# Patient Record
Sex: Male | Born: 1980 | Race: White | Hispanic: No | Marital: Married | Smoking: Former smoker
Health system: Southern US, Community
[De-identification: ages and names within clinical notes are randomized; demographics above are authoritative.]

## PROBLEM LIST (undated history)

## (undated) DIAGNOSIS — F419 Anxiety disorder, unspecified: Secondary | ICD-10-CM

## (undated) DIAGNOSIS — D649 Anemia, unspecified: Secondary | ICD-10-CM

## (undated) DIAGNOSIS — G4730 Sleep apnea, unspecified: Secondary | ICD-10-CM

## (undated) DIAGNOSIS — R7303 Prediabetes: Secondary | ICD-10-CM

## (undated) DIAGNOSIS — I10 Essential (primary) hypertension: Secondary | ICD-10-CM

## (undated) DIAGNOSIS — R519 Headache, unspecified: Secondary | ICD-10-CM

## (undated) HISTORY — PX: WISDOM TOOTH EXTRACTION: SHX21

## (undated) HISTORY — DX: Prediabetes: R73.03

## (undated) HISTORY — PX: SEPTOPLASTY: SUR1290

---

## 2011-03-09 ENCOUNTER — Emergency Department: Payer: Self-pay | Admitting: Internal Medicine

## 2013-08-01 ENCOUNTER — Ambulatory Visit: Payer: Self-pay | Admitting: Emergency Medicine

## 2013-08-18 ENCOUNTER — Ambulatory Visit: Payer: Self-pay | Admitting: Family Medicine

## 2013-09-06 ENCOUNTER — Ambulatory Visit: Payer: Self-pay | Admitting: Physician Assistant

## 2013-11-07 ENCOUNTER — Emergency Department: Payer: Self-pay | Admitting: Emergency Medicine

## 2014-06-20 DIAGNOSIS — J342 Deviated nasal septum: Secondary | ICD-10-CM | POA: Insufficient documentation

## 2014-06-20 DIAGNOSIS — R0982 Postnasal drip: Secondary | ICD-10-CM | POA: Insufficient documentation

## 2014-10-01 ENCOUNTER — Emergency Department: Payer: Self-pay | Admitting: Emergency Medicine

## 2014-12-10 DIAGNOSIS — E781 Pure hyperglyceridemia: Secondary | ICD-10-CM | POA: Insufficient documentation

## 2014-12-10 DIAGNOSIS — D649 Anemia, unspecified: Secondary | ICD-10-CM | POA: Insufficient documentation

## 2014-12-19 DIAGNOSIS — J3489 Other specified disorders of nose and nasal sinuses: Secondary | ICD-10-CM | POA: Insufficient documentation

## 2015-05-03 ENCOUNTER — Ambulatory Visit
Admission: RE | Admit: 2015-05-03 | Discharge: 2015-05-03 | Disposition: A | Discharge status: Home / Self Care | Source: Ambulatory Visit | Attending: Internal Medicine | Admitting: Internal Medicine

## 2015-05-03 ENCOUNTER — Other Ambulatory Visit: Payer: Self-pay | Admitting: Internal Medicine

## 2015-05-03 DIAGNOSIS — R1011 Right upper quadrant pain: Secondary | ICD-10-CM | POA: Diagnosis not present

## 2015-05-03 DIAGNOSIS — R1010 Upper abdominal pain, unspecified: Secondary | ICD-10-CM | POA: Diagnosis present

## 2015-05-03 DIAGNOSIS — R102 Pelvic and perineal pain: Secondary | ICD-10-CM

## 2015-05-03 MED ORDER — IOHEXOL 350 MG/ML SOLN
115.0000 mL | Freq: Once | INTRAVENOUS | Status: AC | PRN
  Administered 2015-05-03: 115 mL via INTRAVENOUS

## 2016-06-25 IMAGING — CT CT ABD-PELV W/ CM
1 of 2 series · 15 of 32 positions shown, 19 images · IV contrast (omnipaque)
Comparison: None.

CLINICAL DATA: Right upper quadrant abdominal pain. Right pelvic
pain in male. Nausea.

EXAM:
CT ABDOMEN AND PELVIS WITH CONTRAST
TECHNIQUE: Multidetector CT imaging of the abdomen and pelvis was performed
using the standard protocol following bolus administration of
intravenous contrast.
CONTRAST:  115mL OMNIPAQUE IOHEXOL 350 MG/ML SOLN

[Series 2: routine abd pel with · axial · 0.79mm/px · z∈[+198,+708]mm · 15 of 112 slices shown, 19 images]
[im 5/112  soft-tissue]
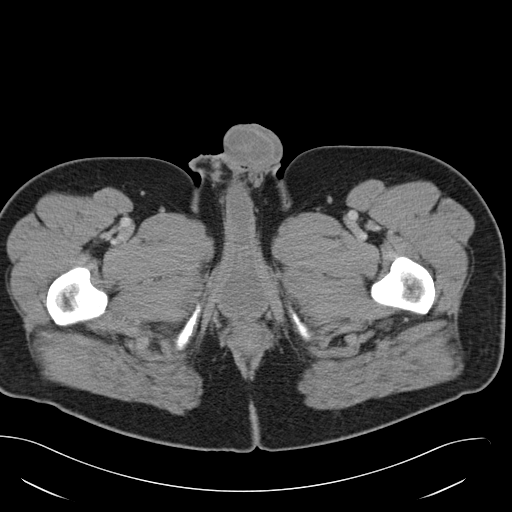
[im 5/112  bone]
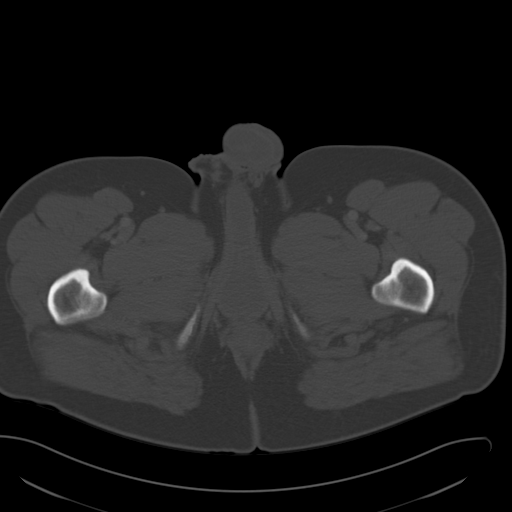
[im 14/112  soft-tissue]
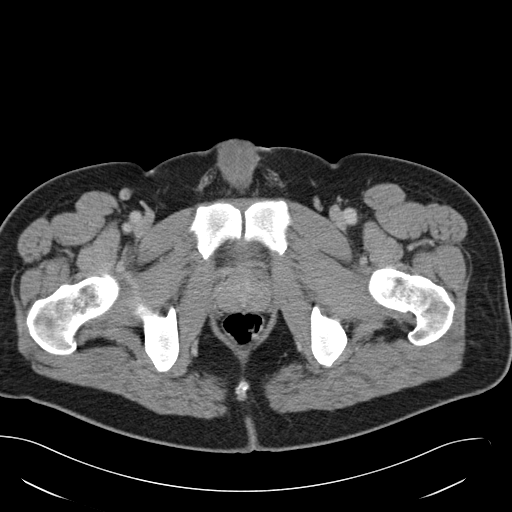
[im 23/112  soft-tissue]
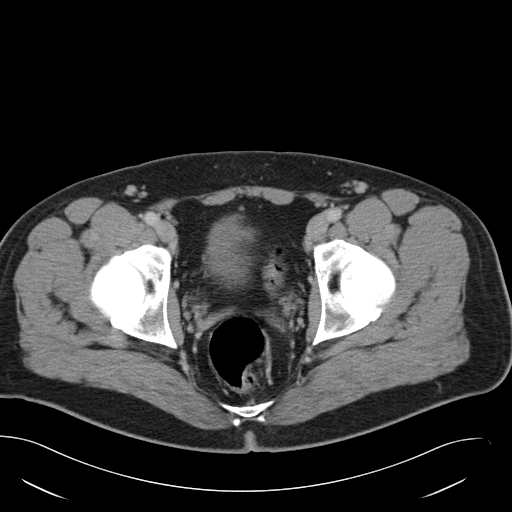
[im 32/112  soft-tissue]
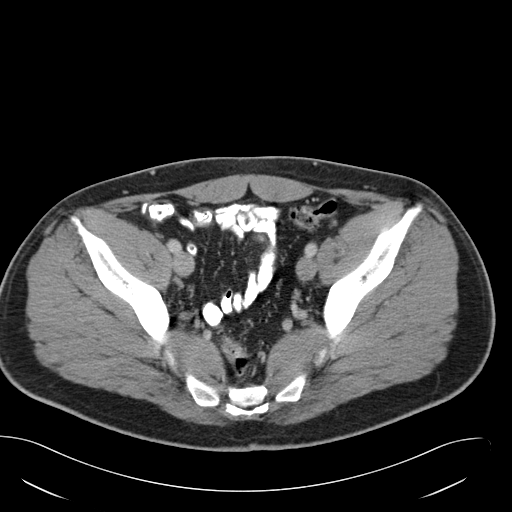
[im 40/112  soft-tissue]
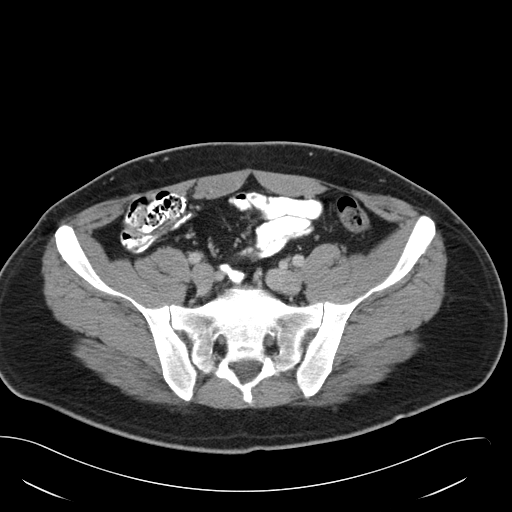
[im 49/112  soft-tissue]
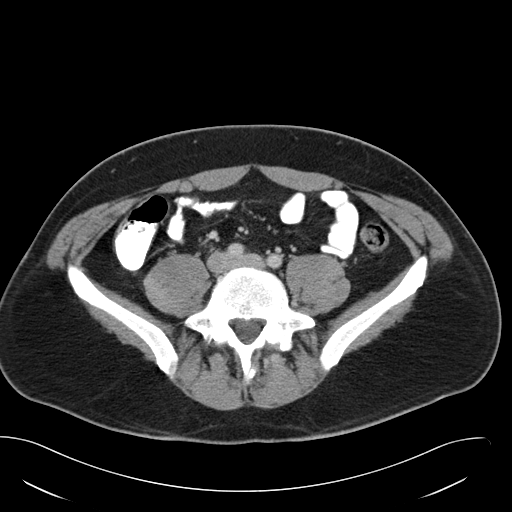
[im 58/112  soft-tissue]
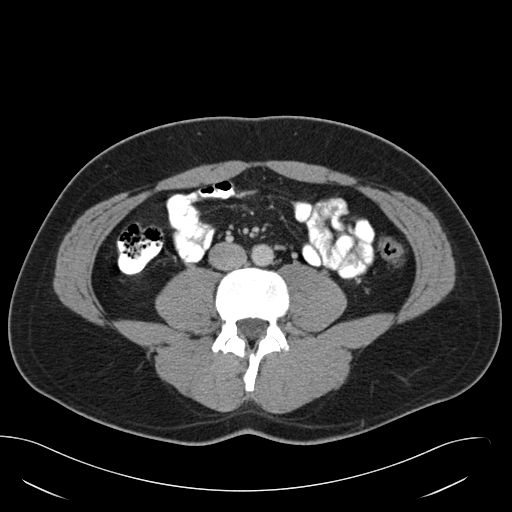
[im 63/112  soft-tissue]
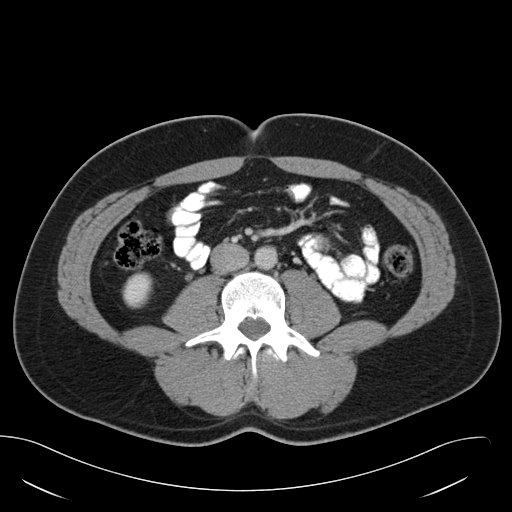
[im 72/112  soft-tissue]
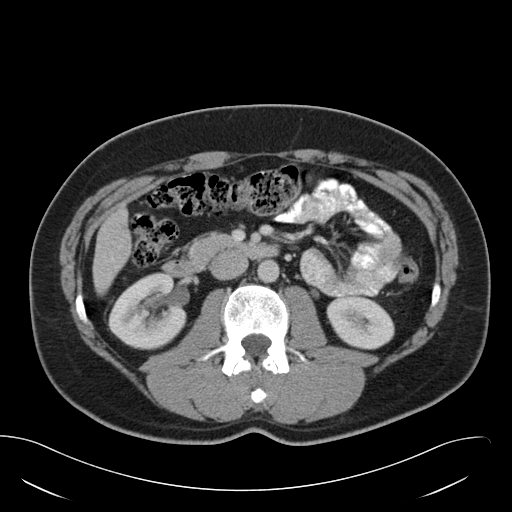
[im 72/112  bone]
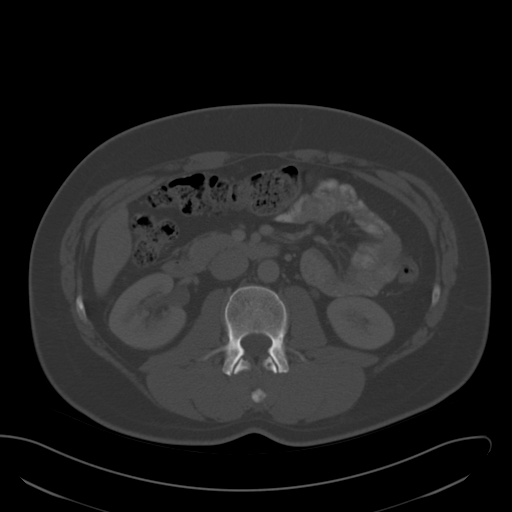
[im 80/112  soft-tissue]
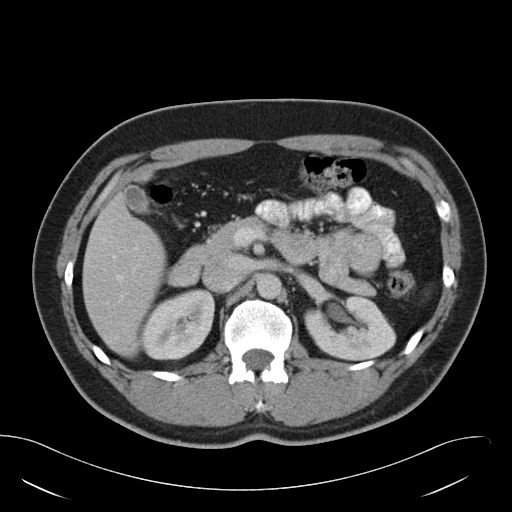
[im 89/112  soft-tissue]
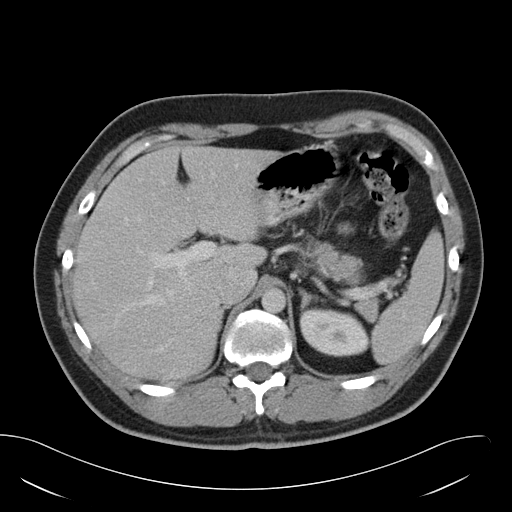
[im 94/112  lung]
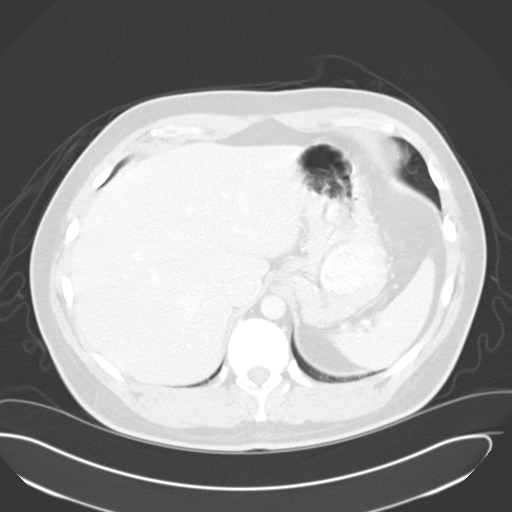
[im 98/112  soft-tissue]
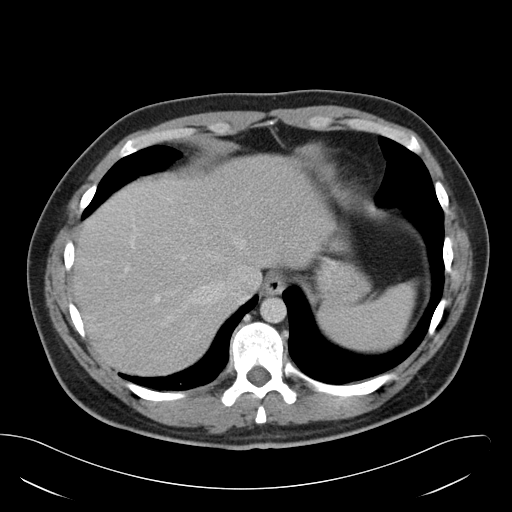
[im 98/112  lung]
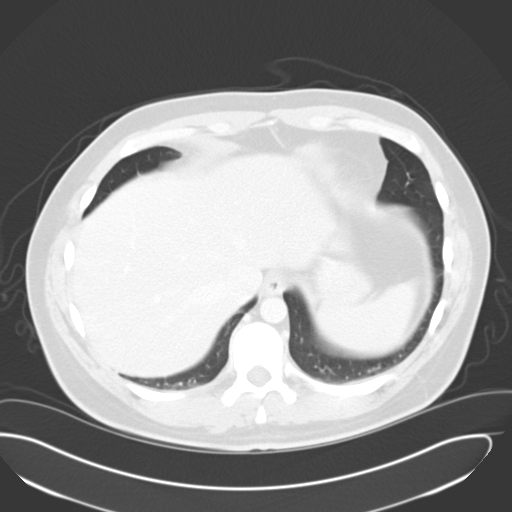
[im 103/112  lung]
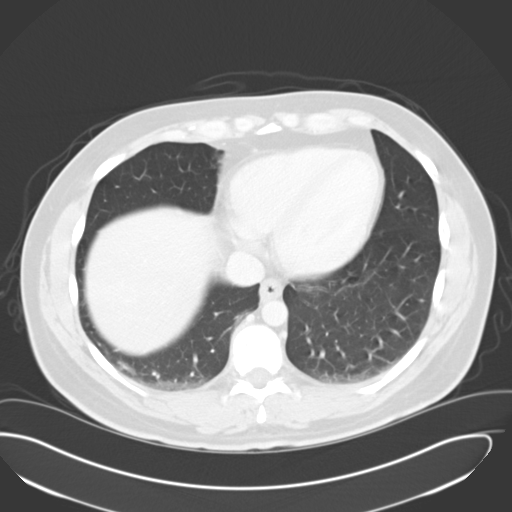
[im 107/112  soft-tissue]
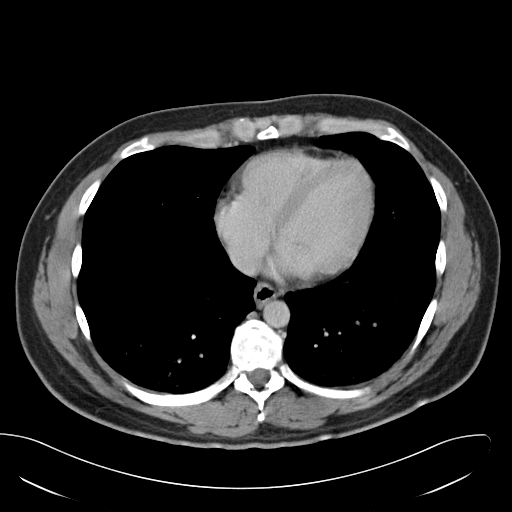
[im 107/112  lung]
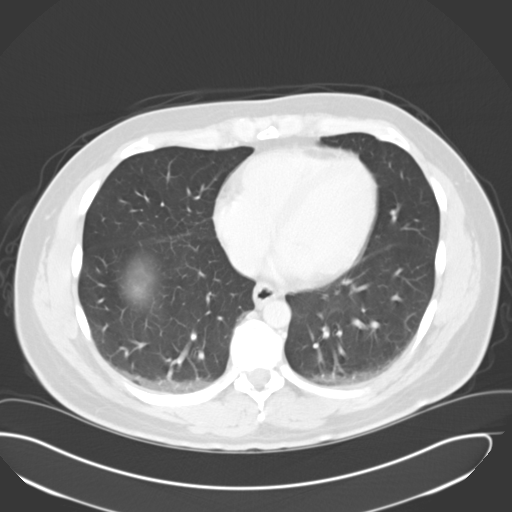

[15 of 32 positions shown; findings below may reference images not displayed]

FINDINGS: Lower Chest: No acute findings.

Hepatobiliary: No masses or other significant abnormality
identified. Gallbladder is unremarkable.

Pancreas: No mass, inflammatory changes, or other significant
abnormality identified.

Spleen:  Within normal limits in size and appearance.

Adrenals:  No masses identified.

Kidneys/Urinary Tract:  No evidence of masses or hydronephrosis.

Stomach/Bowel/Peritoneum: No evidence of wall thickening, mass, or
obstruction. Normal appendix visualized.

Vascular/Lymphatic: No pathologically enlarged lymph nodes
identified. No abdominal aortic aneurysm or other significant
retroperitoneal abnormality demonstrated.

Reproductive:  No mass or other significant abnormality identified.

Other:  None.

Musculoskeletal:  No suspicious bone lesions identified.
IMPRESSION: Negative. No acute findings or other significant abnormality
identified within the abdomen or pelvis.

## 2016-10-30 DIAGNOSIS — H1031 Unspecified acute conjunctivitis, right eye: Secondary | ICD-10-CM | POA: Insufficient documentation

## 2016-12-24 DIAGNOSIS — Z3009 Encounter for other general counseling and advice on contraception: Secondary | ICD-10-CM | POA: Insufficient documentation

## 2016-12-24 DIAGNOSIS — B977 Papillomavirus as the cause of diseases classified elsewhere: Secondary | ICD-10-CM | POA: Insufficient documentation

## 2017-01-21 DIAGNOSIS — Z302 Encounter for sterilization: Secondary | ICD-10-CM | POA: Insufficient documentation

## 2017-05-11 DIAGNOSIS — Z9852 Vasectomy status: Secondary | ICD-10-CM | POA: Insufficient documentation

## 2017-05-11 DIAGNOSIS — A630 Anogenital (venereal) warts: Secondary | ICD-10-CM | POA: Insufficient documentation

## 2018-10-27 DIAGNOSIS — R7989 Other specified abnormal findings of blood chemistry: Secondary | ICD-10-CM | POA: Insufficient documentation

## 2018-12-30 ENCOUNTER — Ambulatory Visit: Attending: Specialist

## 2018-12-30 DIAGNOSIS — G4733 Obstructive sleep apnea (adult) (pediatric): Secondary | ICD-10-CM | POA: Insufficient documentation

## 2018-12-30 DIAGNOSIS — G4761 Periodic limb movement disorder: Secondary | ICD-10-CM | POA: Diagnosis not present

## 2019-07-14 ENCOUNTER — Other Ambulatory Visit: Admission: RE | Admit: 2019-07-14 | Source: Ambulatory Visit

## 2019-07-18 ENCOUNTER — Ambulatory Visit: Attending: Specialist

## 2019-07-18 DIAGNOSIS — G4733 Obstructive sleep apnea (adult) (pediatric): Secondary | ICD-10-CM | POA: Diagnosis not present

## 2019-07-19 ENCOUNTER — Other Ambulatory Visit: Payer: Self-pay

## 2019-09-14 ENCOUNTER — Emergency Department
Admission: EM | Admit: 2019-09-14 | Discharge: 2019-09-14 | Disposition: A | Discharge status: Home / Self Care | Attending: Emergency Medicine | Admitting: Emergency Medicine

## 2019-09-14 ENCOUNTER — Encounter: Payer: Self-pay | Admitting: Physician Assistant

## 2019-09-14 ENCOUNTER — Other Ambulatory Visit: Payer: Self-pay

## 2019-09-14 DIAGNOSIS — Z20828 Contact with and (suspected) exposure to other viral communicable diseases: Secondary | ICD-10-CM | POA: Insufficient documentation

## 2019-09-14 DIAGNOSIS — J00 Acute nasopharyngitis [common cold]: Secondary | ICD-10-CM | POA: Insufficient documentation

## 2019-09-14 DIAGNOSIS — R05 Cough: Secondary | ICD-10-CM | POA: Diagnosis present

## 2019-09-14 DIAGNOSIS — H6501 Acute serous otitis media, right ear: Secondary | ICD-10-CM | POA: Insufficient documentation

## 2019-09-14 LAB — SARS CORONAVIRUS 2 (TAT 6-24 HRS): SARS Coronavirus 2: NEGATIVE

## 2019-09-14 MED ORDER — BENZONATATE 100 MG PO CAPS: ORAL_CAPSULE | ORAL | 0 refills | Status: DC

## 2019-09-14 MED ORDER — CETIRIZINE-PSEUDOEPHEDRINE ER 5-120 MG PO TB12: 1.0000 | ORAL_TABLET | Freq: Two times a day (BID) | ORAL | 0 refills | Status: DC

## 2019-09-14 MED ORDER — FLUTICASONE PROPIONATE 50 MCG/ACT NA SUSP: 2.0000 | Freq: Every day | NASAL | 0 refills | Status: AC

## 2019-09-14 MED ORDER — CETIRIZINE-PSEUDOEPHEDRINE ER 5-120 MG PO TB12: 1.0000 | ORAL_TABLET | Freq: Two times a day (BID) | ORAL | 0 refills | Status: AC

## 2019-09-14 MED ORDER — PREDNISONE 20 MG PO TABS: 20.0000 mg | ORAL_TABLET | Freq: Two times a day (BID) | ORAL | 0 refills | Status: AC

## 2019-09-14 MED ORDER — FLUTICASONE PROPIONATE 50 MCG/ACT NA SUSP: 2.0000 | Freq: Every day | NASAL | 0 refills | Status: DC

## 2019-09-14 MED ORDER — PREDNISONE 20 MG PO TABS: 20.0000 mg | ORAL_TABLET | Freq: Two times a day (BID) | ORAL | 0 refills | Status: DC

## 2019-09-14 NOTE — Discharge Instructions (Signed)
You are being tested for COVID. Remain at home until results are verified. Take the prescription meds as directed.

## 2019-09-14 NOTE — ED Notes (Signed)
Pt c/o cough, nasal congestion, and post nasal drip since Sunday when he returned from seeing his parents. Pt states his parents tested positive for Covid. Reports yellow/white nasal drainage at this time. Reports coughing up yellow/white mucous at this time.

## 2019-09-14 NOTE — ED Notes (Signed)
NAD noted at time of D/C. Pt denies questions or concerns. Pt ambulatory to the lobby at this time.  

## 2019-09-14 NOTE — ED Provider Notes (Signed)
[facility] Emergency Department Provider Note ____________________________________________  Time seen: 1239  I have reviewed the triage vital signs and the nursing notes.  HISTORY  Chief Complaint  Cough   HPI Alan Day is a 38 y.o. male presents himself to the ED for  [place] overall possible Covid exposure.  Patient reports a return from a driving trip to [place] and was told that his parents were tested positive in the interim.  He reports nasal drainage as well as cough, and ear popping.  He denies any frank fevers, nausea, vomiting, or diarrhea.  He also denies any taste/smell sensation changes.  Patient has been tested for coronavirus in the past but reports a negative test.  History reviewed. No pertinent past medical history.  There are no active problems to display for this patient.  History reviewed. No pertinent surgical history.  Prior to Admission medications   Medication Sig Start Date End Date Taking? Authorizing Provider  benzonatate (TESSALON PERLES) 100 MG capsule Take 1-2 tabs TID prn cough 09/14/19   Larkin Morelos, Dannielle Karvonen, PA-C  cetirizine-pseudoephedrine (ZYRTEC-D) 5-120 MG tablet Take 1 tablet by mouth 2 (two) times daily for 10 days. 09/14/19 09/24/19  Arnesha Schiraldi, Dannielle Karvonen, PA-C  fluticasone (FLONASE) 50 MCG/ACT nasal spray Place 2 sprays into both nostrils daily. 09/14/19   Kaelei Wheeler, Dannielle Karvonen, PA-C  predniSONE (DELTASONE) 20 MG tablet Take 1 tablet (20 mg total) by mouth 2 (two) times daily with a meal for 5 days. 09/14/19 09/19/19  Dashea Mcmullan, Dannielle Karvonen, PA-C    Allergies Patient has no known allergies.  No family history on file.  Social History Social History   Tobacco Use  . Smoking status: Not on file  Substance Use Topics  . Alcohol use: Not on file  . Drug use: Not on file    Review of Systems  Constitutional: Negative for fever. Eyes: Negative for visual changes. ENT: Negative for sore throat.   Positive for postnasal drip. Cardiovascular: Negative for chest pain. Respiratory: Negative for shortness of breath.  Reports intermittent cough. Gastrointestinal: Negative for abdominal pain, vomiting and diarrhea. Genitourinary: Negative for dysuria. Musculoskeletal: Negative for back pain. Skin: Negative for rash. Neurological: Negative for headaches, focal weakness or numbness. ____________________________________________  PHYSICAL EXAM:  VITAL SIGNS: ED Triage Vitals [09/14/19 1114]  Enc Vitals Group     BP (!) 151/82     Pulse Rate 88     Resp 18     Temp 99.1 F (37.3 C)     Temp Source Oral     SpO2 96 %     Weight 245 lb (111.1 kg)     Height 6\' 4"  (1.93 m)     Head Circumference      Peak Flow      Pain Score 0     Pain Loc      Pain Edu?      Excl. in [place]?     Constitutional: Alert and oriented. Well appearing and in no distress. Head: Normocephalic and atraumatic. Eyes: Conjunctivae are normal. PERRL. Normal extraocular movements Ears: Canals clear. TMs intact bilaterally.  Mild right serous effusion noted. Nose: No congestion/rhinorrhea/epistaxis.  Septum is chronically deviated, turbinates are pink, moist, and without friability. Neck: Supple. No thyromegaly. Hematological/Lymphatic/Immunological: No cervical lymphadenopathy. Cardiovascular: Normal rate, regular rhythm. Normal distal pulses. Respiratory: Normal respiratory effort. No wheezes/rales/rhonchi. Musculoskeletal: Nontender with normal range of motion in all extremities.  Neurologic:  Normal gait without ataxia. Normal speech and  language. No gross focal neurologic deficits are appreciated. ____________________________________________   LABS (pertinent positives/negatives) Labs Reviewed  SARS CORONAVIRUS 2 (TAT 6-24 HRS)  ____________________________________________  PROCEDURES  Procedures ____________________________________________  INITIAL IMPRESSION / ASSESSMENT AND PLAN / ED  COURSE  Patient with ED evaluation of a 2-day complaint of sinus congestion, nasal drainage, and ear pressure.  He is also concerned for possible Covid exposure.  Exam is overall benign and reassuring at this time.  Symptoms likely represent a mild rhinitis and early sinusitis.  Patient will be discharged with a prescription for prednisone, Flonase, Tessalon Perles, and cetirizine-pseudoephedrine.  A Covid test is pending at the time of discharge.  He will follow-up with his primary provider, or return to the ED as needed.  He should remain in the house quarantine until results are available.  Alan Day was evaluated in Emergency Department on 09/14/2019 for the symptoms described in the history of present illness. He was evaluated in the context of the global COVID-19 pandemic, which necessitated consideration that the patient might be at risk for infection with the SARS-CoV-2 virus that causes COVID-19. Institutional protocols and algorithms that pertain to the evaluation of patients at risk for COVID-19 are in a state of rapid change based on information released by regulatory bodies including the CDC and federal and state organizations. These policies and algorithms were followed during the patient's care in the ED. ____________________________________________  FINAL CLINICAL IMPRESSION(S) / ED DIAGNOSES  Final diagnoses:  Acute rhinitis  Right acute serous otitis media, recurrence not specified      Lissa Hoard, PA-C 09/14/19 1953    Chesley Noon, MD 09/14/19 2030

## 2019-09-14 NOTE — ED Triage Notes (Signed)
Pt state that he just returned from [place] and was told his parents tested positive for covid, pt reports that he has post nasal drip causing him to cough and his ear is popping

## 2019-09-20 DIAGNOSIS — G4733 Obstructive sleep apnea (adult) (pediatric): Secondary | ICD-10-CM | POA: Insufficient documentation

## 2020-04-16 DIAGNOSIS — E559 Vitamin D deficiency, unspecified: Secondary | ICD-10-CM | POA: Insufficient documentation

## 2020-06-14 ENCOUNTER — Ambulatory Visit
Admission: EM | Admit: 2020-06-14 | Discharge: 2020-06-14 | Disposition: A | Discharge status: Home / Self Care | Attending: Emergency Medicine | Admitting: Emergency Medicine

## 2020-06-14 DIAGNOSIS — Z23 Encounter for immunization: Secondary | ICD-10-CM

## 2020-06-14 DIAGNOSIS — S61210A Laceration without foreign body of right index finger without damage to nail, initial encounter: Secondary | ICD-10-CM | POA: Diagnosis not present

## 2020-06-14 MED ORDER — TETANUS-DIPHTH-ACELL PERTUSSIS 5-2.5-18.5 LF-MCG/0.5 IM SUSP
0.5000 mL | Freq: Once | INTRAMUSCULAR | Status: AC
  Administered 2020-06-14: 0.5 mL via INTRAMUSCULAR

## 2020-06-14 NOTE — Discharge Instructions (Signed)
Your stitches need to be removed in 7 to 10 days.  Keep your wound clean and dry.  Wash it gently twice a day with soap and water.  Apply an antibiotic cream twice a day.    Return here if you see signs of infection, such as increased pain, redness, pus-like drainage, warmth, fever, chills, or other concerning symptoms.

## 2020-06-14 NOTE — ED Provider Notes (Signed)
Renaldo Fiddler    CSN: [id number] Arrival date & time: 06/14/20  1802      History   Chief Complaint Chief Complaint  Patient presents with  . Laceration    HPI Alan Day is a 39 y.o. male.   Patient presents with a laceration on his right index finger.  This occurred this afternoon when he cut it on a piece of metal while working on drywall.  Bleeding controlled with direct pressure.  He denies numbness, weakness, tingling.  Unknown last tetanus.  The history is provided by the patient.    History reviewed. No pertinent past medical history.  There are no problems to display for this patient.   Past Surgical History:  Procedure Laterality Date  . SEPTOPLASTY         Home Medications    Prior to Admission medications   Medication Sig Start Date End Date Taking? Authorizing Provider  benzonatate (TESSALON PERLES) 100 MG capsule Take 1-2 tabs TID prn cough 09/14/19   Menshew, Charlesetta Ivory, PA-C  fluticasone (FLONASE) 50 MCG/ACT nasal spray Place 2 sprays into both nostrils daily. 09/14/19   Menshew, Charlesetta Ivory, PA-C    Family History History reviewed. No pertinent family history.  Social History Social History   Tobacco Use  . Smoking status: Current Every Day Smoker  . Smokeless tobacco: Never Used  Substance Use Topics  . Alcohol use: Yes    Comment: occ  . Drug use: Not on file     Allergies   Patient has no known allergies.   Review of Systems Review of Systems  Constitutional: Negative for chills and fever.  HENT: Negative for ear pain and sore throat.   Eyes: Negative for pain and visual disturbance.  Respiratory: Negative for cough and shortness of breath.   Cardiovascular: Negative for chest pain and palpitations.  Gastrointestinal: Negative for abdominal pain and vomiting.  Genitourinary: Negative for dysuria and hematuria.  Musculoskeletal: Negative for arthralgias and back pain.  Skin: Positive for wound. Negative for  color change and rash.  Neurological: Negative for seizures, syncope, weakness and numbness.  All other systems reviewed and are negative.    Physical Exam Triage Vital Signs ED Triage Vitals  Enc Vitals Group     BP 06/14/20 1804 (!) 149/84     Pulse Rate 06/14/20 1804 87     Resp 06/14/20 1804 16     Temp 06/14/20 1804 98.1 F (36.7 C)     Temp src --      SpO2 06/14/20 1804 94 %     Weight --      Height --      Head Circumference --      Peak Flow --      Pain Score 06/14/20 1803 0     Pain Loc --      Pain Edu? --      Excl. in [place]? --    No data found.  Updated Vital Signs BP (!) 149/84   Pulse 87   Temp 98.1 F (36.7 C)   Resp 16   SpO2 94%   Visual Acuity Right Eye Distance:   Left Eye Distance:   Bilateral Distance:    Right Eye Near:   Left Eye Near:    Bilateral Near:     Physical Exam Vitals and nursing note reviewed.  Constitutional:      General: He is not in acute distress.    Appearance: He is  well-developed.  HENT:     Head: Normocephalic and atraumatic.     Mouth/Throat:     Mouth: Mucous membranes are moist.  Eyes:     Conjunctiva/sclera: Conjunctivae normal.  Cardiovascular:     Rate and Rhythm: Normal rate and regular rhythm.     Heart sounds: No murmur heard.   Pulmonary:     Effort: Pulmonary effort is normal. No respiratory distress.     Breath sounds: Normal breath sounds.  Abdominal:     Palpations: Abdomen is soft.     Tenderness: There is no abdominal tenderness.  Musculoskeletal:        General: No swelling or deformity. Normal range of motion.     Cervical back: Neck supple.  Skin:    General: Skin is warm and dry.     Capillary Refill: Capillary refill takes less than 2 seconds.     Findings: Lesion present. No erythema.     Comments: Right index finger: 1 cm laceration; no active bleeding.   Neurological:     General: No focal deficit present.     Mental Status: He is alert and oriented to person, place, and  time.     Sensory: No sensory deficit.     Motor: No weakness.     Gait: Gait normal.  Psychiatric:        Mood and Affect: Mood normal.        Behavior: Behavior normal.      UC Treatments / Results  Labs (all labs ordered are listed, but only abnormal results are displayed) Labs Reviewed - No data to display  EKG   Radiology No results found.  Procedures Laceration Repair  Date/Time: 06/14/2020 6:21 PM Performed by: Mickie Bail, NP Authorized by: Mickie Bail, NP   Consent:    Consent obtained:  Verbal   Consent given by:  Patient   Risks discussed:  Infection, pain and poor wound healing Anesthesia (see MAR for exact dosages):    Anesthesia method:  Local infiltration   Local anesthetic:  Lidocaine 1% w/o epi Laceration details:    Location:  Finger   Finger location:  R index finger   Length (cm):  1   Depth (mm):  2 Repair type:    Repair type:  Simple Pre-procedure details:    Preparation:  Patient was prepped and draped in usual sterile fashion Exploration:    Hemostasis achieved with:  Direct pressure   Wound exploration: wound explored through full range of motion and entire depth of wound probed and visualized     Contaminated: no   Treatment:    Area cleansed with:  Betadine   Amount of cleaning:  Standard   Irrigation solution:  Sterile saline   Irrigation method:  Syringe   Visualized foreign bodies/material removed: no   Skin repair:    Repair method:  Sutures   Suture size:  5-0   Suture material:  Nylon   Suture technique:  Simple interrupted   Number of sutures:  3 Approximation:    Approximation:  Close Post-procedure details:    Dressing:  Antibiotic ointment and non-adherent dressing   Patient tolerance of procedure:  Tolerated well, no immediate complications   (including critical care time)  Medications Ordered in UC Medications  Tdap (BOOSTRIX) injection 0.5 mL (0.5 mLs Intramuscular Given 06/14/20 1808)    Initial  Impression / Assessment and Plan / UC Course  I have reviewed the triage vital signs and the nursing  notes.  Pertinent labs & imaging results that were available during my care of the patient were reviewed by me and considered in my medical decision making (see chart for details).   Laceration of right index finger.  3 sutures.  Wound care instructions and signs of infection discussed with patient.  Instructed him to return here in 7-10 days for removal of his sutures.  Tetanus updated.  Patient agrees to plan of care.      Final Clinical Impressions(s) / UC Diagnoses   Final diagnoses:  Laceration of right index finger without foreign body without damage to nail, initial encounter     Discharge Instructions     Your stitches need to be removed in 7 to 10 days.  Keep your wound clean and dry.  Wash it gently twice a day with soap and water.  Apply an antibiotic cream twice a day.    Return here if you see signs of infection, such as increased pain, redness, pus-like drainage, warmth, fever, chills, or other concerning symptoms.         ED Prescriptions    None     PDMP not reviewed this encounter.   Mickie Bail, NP 06/14/20 1824

## 2020-06-14 NOTE — ED Triage Notes (Signed)
Patient reports he cut his right pointer finger on a piece of metal. Unsure of last tetanus.

## 2022-01-11 DIAGNOSIS — S93401A Sprain of unspecified ligament of right ankle, initial encounter: Secondary | ICD-10-CM | POA: Insufficient documentation

## 2022-02-08 ENCOUNTER — Encounter: Payer: Self-pay | Admitting: Emergency Medicine

## 2022-02-08 ENCOUNTER — Ambulatory Visit: Admission: EM | Admit: 2022-02-08 | Discharge: 2022-02-08 | Disposition: A | Discharge status: Home / Self Care

## 2022-02-08 DIAGNOSIS — F419 Anxiety disorder, unspecified: Secondary | ICD-10-CM | POA: Insufficient documentation

## 2022-02-08 DIAGNOSIS — H6692 Otitis media, unspecified, left ear: Secondary | ICD-10-CM

## 2022-02-08 DIAGNOSIS — M25569 Pain in unspecified knee: Secondary | ICD-10-CM | POA: Insufficient documentation

## 2022-02-08 DIAGNOSIS — H15009 Unspecified scleritis, unspecified eye: Secondary | ICD-10-CM | POA: Insufficient documentation

## 2022-02-08 DIAGNOSIS — H1033 Unspecified acute conjunctivitis, bilateral: Secondary | ICD-10-CM

## 2022-02-08 DIAGNOSIS — H52229 Regular astigmatism, unspecified eye: Secondary | ICD-10-CM | POA: Insufficient documentation

## 2022-02-08 DIAGNOSIS — H5210 Myopia, unspecified eye: Secondary | ICD-10-CM | POA: Insufficient documentation

## 2022-02-08 DIAGNOSIS — J069 Acute upper respiratory infection, unspecified: Secondary | ICD-10-CM

## 2022-02-08 DIAGNOSIS — Z029 Encounter for administrative examinations, unspecified: Secondary | ICD-10-CM | POA: Insufficient documentation

## 2022-02-08 DIAGNOSIS — S91009A Unspecified open wound, unspecified ankle, initial encounter: Secondary | ICD-10-CM | POA: Insufficient documentation

## 2022-02-08 DIAGNOSIS — G43909 Migraine, unspecified, not intractable, without status migrainosus: Secondary | ICD-10-CM | POA: Insufficient documentation

## 2022-02-08 DIAGNOSIS — S91309A Unspecified open wound, unspecified foot, initial encounter: Secondary | ICD-10-CM | POA: Insufficient documentation

## 2022-02-08 DIAGNOSIS — M222X9 Patellofemoral disorders, unspecified knee: Secondary | ICD-10-CM | POA: Insufficient documentation

## 2022-02-08 DIAGNOSIS — F411 Generalized anxiety disorder: Secondary | ICD-10-CM | POA: Insufficient documentation

## 2022-02-08 DIAGNOSIS — R0683 Snoring: Secondary | ICD-10-CM | POA: Insufficient documentation

## 2022-02-08 DIAGNOSIS — F17200 Nicotine dependence, unspecified, uncomplicated: Secondary | ICD-10-CM | POA: Insufficient documentation

## 2022-02-08 DIAGNOSIS — B356 Tinea cruris: Secondary | ICD-10-CM | POA: Insufficient documentation

## 2022-02-08 DIAGNOSIS — A6920 Lyme disease, unspecified: Secondary | ICD-10-CM | POA: Insufficient documentation

## 2022-02-08 DIAGNOSIS — F4001 Agoraphobia with panic disorder: Secondary | ICD-10-CM | POA: Insufficient documentation

## 2022-02-08 DIAGNOSIS — R21 Rash and other nonspecific skin eruption: Secondary | ICD-10-CM | POA: Insufficient documentation

## 2022-02-08 HISTORY — DX: Essential (primary) hypertension: I10

## 2022-02-08 MED ORDER — AMOXICILLIN 875 MG PO TABS: 875.0000 mg | ORAL_TABLET | Freq: Two times a day (BID) | ORAL | 0 refills | Status: AC

## 2022-02-08 MED ORDER — POLYMYXIN B-TRIMETHOPRIM 10000-0.1 UNIT/ML-% OP SOLN: 1.0000 [drp] | Freq: Four times a day (QID) | OPHTHALMIC | 0 refills | Status: AC

## 2022-02-08 NOTE — ED Triage Notes (Signed)
Pt states he has had nasal drainage and congestion with bilateral eye irritation since yesterday. Also states his throat is "tight", but not sore.  ?

## 2022-02-08 NOTE — Discharge Instructions (Addendum)
Take the amoxicillin and use the eye drops as directed.  Follow up with your primary care provider if your symptoms are not improving.   ? ?

## 2022-02-08 NOTE — ED Provider Notes (Signed)
?[facility] ? ? ? ?CSN: [id number] ?Arrival date & time: 02/08/22  6789 ? ? ?  ? ?History   ?Chief Complaint ?Chief Complaint  ?Patient presents with  ? Nasal Congestion  ? Eye Problem  ? ? ?HPI ?Alan Day is a 41 y.o. male.  Patient presents with bilateral eye redness, crusting in his lashes, yellow-green drainage x1 day.  No eye pain or changes in vision.  He also reports bilateral ear pain, sinus congestion, sore swollen lymph nodes in his neck.  No treatment at home.  He denies fever, rash, sore throat, cough, shortness of breath, or other symptoms.  His medical history includes hypertension, anemia, abnormal LFTs, anxiety, panic disorder with agoraphobia, elevated triglycerides, migraine headaches. ? ?The history is provided by the patient and medical records.  ? ?Past Medical History:  ?Diagnosis Date  ? Hypertension   ? ? ?Patient Active Problem List  ? Diagnosis Date Noted  ? Anxiety 02/08/2022  ? Dermatophytosis of groin and perianal area 02/08/2022  ? Generalized anxiety disorder 02/08/2022  ? Lyme disease 02/08/2022  ? Migraines 02/08/2022  ? Encounters for administrative purpose 02/08/2022  ? Myopia 02/08/2022  ? Nicotine dependence 02/08/2022  ? Open wound of ankle 02/08/2022  ? Open wound of foot 02/08/2022  ? Pain in joint, lower leg 02/08/2022  ? 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 Patellofemoral syndrome 02/08/2022  ? Rash and other nonspecific skin eruption 02/08/2022  ? Regular astigmatism 02/08/2022  ? Scleritis 02/08/2022  ? Snoring 02/08/2022  ? Sprain of right ankle 01/11/2022  ? Vitamin D deficiency 04/16/2020  ? OSA on CPAP 09/20/2019  ? Abnormal LFTs (liver function tests) 10/27/2018  ? Condyloma acuminatum of perianal region 05/11/2017  ? Condyloma of male genitalia 05/11/2017  ? Status post vasectomy 05/11/2017  ? Encounter for sterilization 01/21/2017  ? 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 Consultation for sterilization 12/24/2016  ? Unspecified acute conjunctivitis, right  eye 10/30/2016  ? 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 Chronic anemia 12/10/2014  ? Hypertriglyceridemia 12/10/2014  ? Deviated nasal septum 06/20/2014  ? Postnasal drip 06/20/2014  ? ? ?Past Surgical History:  ?Procedure Laterality Date  ? SEPTOPLASTY    ? ? ? ? ? ?Home Medications   ? ?Prior to Admission medications   ?Medication Sig Start Date End Date Taking? Authorizing Provider  ?amoxicillin (AMOXIL) 875 MG tablet Take 1 tablet (875 mg total) by mouth 2 (two) times daily for 10 days. 02/08/22 02/18/22 Yes Mickie Bail, NP  ?trimethoprim-polymyxin b (POLYTRIM) ophthalmic solution Place 1 drop into both eyes 4 (four) times daily for 7 days. 02/08/22 02/15/22 Yes Mickie Bail, NP  ?benzonatate (TESSALON PERLES) 100 MG capsule Take 1-2 tabs TID prn cough 09/14/19   Menshew, Charlesetta Ivory, PA-C  ?fluticasone (FLONASE) 50 MCG/ACT nasal spray Place 2 sprays into both nostrils daily. 09/14/19   Menshew, Charlesetta Ivory, PA-C  ?hydrochlorothiazide (HYDRODIURIL) 12.5 MG tablet Take 12.5 mg by mouth daily. 02/05/22   [provider]  ? ? ?Family History ?History reviewed. No pertinent family history. ? ?Social History ?Social History  ? ?Tobacco Use  ? Smoking status: Every Day  ? Smokeless tobacco: Never  ?Substance Use Topics  ? Alcohol use: Yes  ?  Comment: occ  ? ? ? ?Allergies   ?Patient has no known allergies. ? ? ?Review of Systems ?Review of Systems  ?Constitutional:  Negative for chills and fever.  ?HENT:  Positive for congestion, postnasal drip  and sinus pressure. Negative for ear pain, sore throat, trouble swallowing and voice change.   ?Eyes:  Positive for discharge and redness. Negative for pain and visual disturbance.  ?Respiratory:  Negative for cough and shortness of breath.   ?Cardiovascular:  Negative for chest pain and palpitations.  ?Gastrointestinal:  Negative for diarrhea and vomiting.  ?Skin:  Negative for color change and rash.  ?All other systems reviewed and are negative. ? ? ?Physical  Exam ?Triage Vital Signs ?ED Triage Vitals  ?Enc Vitals Group  ?   BP 02/08/22 0904 134/85  ?   Pulse Rate 02/08/22 0904 85  ?   Resp 02/08/22 0904 18  ?   Temp 02/08/22 0904 98.6 ?F (37 ?C)  ?   Temp src --   ?   SpO2 02/08/22 0904 95 %  ?   Weight --   ?   Height --   ?   Head Circumference --   ?   Peak Flow --   ?   Pain Score 02/08/22 0903 0  ?   Pain Loc --   ?   Pain Edu? --   ?   Excl. in [place]? --   ? ?No data found. ? ?Updated Vital Signs ?BP 134/85   Pulse 85   Temp 98.6 ?F (37 ?C)   Resp 18   SpO2 95%  ? ?Visual Acuity ?Right Eye Distance:   ?Left Eye Distance:   ?Bilateral Distance:   ? ?Right Eye Near:   ?Left Eye Near:    ?Bilateral Near:    ? ?Physical Exam ?Vitals and nursing note reviewed.  ?Constitutional:   ?   General: He is not in acute distress. ?   Appearance: He is well-developed.  ?HENT:  ?   Head: Normocephalic and atraumatic.  ?   Right Ear: Tympanic membrane normal.  ?   Left Ear: Tympanic membrane is erythematous.  ?   Nose: Congestion and rhinorrhea present.  ?   Mouth/Throat:  ?   Mouth: Mucous membranes are moist.  ?   Pharynx: Oropharynx is clear.  ?Eyes:  ?   General: Lids are normal. Vision grossly intact.  ?   Extraocular Movements: Extraocular movements intact.  ?   Conjunctiva/sclera:  ?   Right eye: Right conjunctiva is injected.  ?   Left eye: Left conjunctiva is injected.  ?   Pupils: Pupils are equal, round, and reactive to light.  ?Cardiovascular:  ?   Rate and Rhythm: Normal rate and regular rhythm.  ?   Heart sounds: Normal heart sounds.  ?Pulmonary:  ?   Effort: Pulmonary effort is normal. No respiratory distress.  ?   Breath sounds: Normal breath sounds.  ?Musculoskeletal:  ?   Cervical back: Neck supple.  ?Skin: ?   General: Skin is warm and dry.  ?Neurological:  ?   Mental Status: He is alert.  ?Psychiatric:     ?   Mood and Affect: Mood normal.     ?   Behavior: Behavior normal.  ? ? ? ?UC Treatments / Results  ?Labs ?(all labs ordered are listed, but only abnormal  results are displayed) ?Labs Reviewed - No data to display ? ?EKG ? ? ?Radiology ?No results found. ? ?Procedures ?Procedures (including critical care time) ? ?Medications Ordered in UC ?Medications - No data to display ? ?Initial Impression / Assessment and Plan / UC Course  ?I have reviewed the triage vital signs and the nursing notes. ? ?  Pertinent labs & imaging results that were available during my care of the patient were reviewed by me and considered in my medical decision making (see chart for details). ? ?  ?Left otitis media, URI, conjunctivitis.  Treating with amoxicillin and Polytrim eyedrops.  Discussed symptomatic treatment, including ibuprofen and plain Mucinex.  Instructed patient to follow-up with his PCP if his symptoms are not improving.  He has an appointment with his PCP already scheduled on Monday for follow-up of newly diagnosed hypertension.  Education provided on otitis media, upper respiratory infection, bacterial conjunctivitis.  Patient agrees to plan of care. ? ?Final Clinical Impressions(s) / UC Diagnoses  ? ?Final diagnoses:  ?Left otitis media, unspecified otitis media type  ?Acute upper respiratory infection  ?Acute bacterial conjunctivitis of both eyes  ? ? ? ?Discharge Instructions   ? ?  ?Take the amoxicillin and use the eye drops as directed.  Follow up with your primary care provider if your symptoms are not improving.   ? ? ? ? ? ?ED Prescriptions   ? ? Medication Sig Dispense Auth. Provider  ? trimethoprim-polymyxin b (POLYTRIM) ophthalmic solution Place 1 drop into both eyes 4 (four) times daily for 7 days. 10 mL Sharion Balloon, NP  ? amoxicillin (AMOXIL) 875 MG tablet Take 1 tablet (875 mg total) by mouth 2 (two) times daily for 10 days. 20 tablet Sharion Balloon, NP  ? ?  ? ?PDMP not reviewed this encounter. ?  ?Sharion Balloon, NP ?02/08/22 [id number] ? ?

## 2022-03-12 ENCOUNTER — Other Ambulatory Visit: Payer: Self-pay | Admitting: Internal Medicine

## 2022-03-12 DIAGNOSIS — R7989 Other specified abnormal findings of blood chemistry: Secondary | ICD-10-CM

## 2022-03-19 ENCOUNTER — Ambulatory Visit
Admission: RE | Admit: 2022-03-19 | Discharge: 2022-03-19 | Disposition: A | Discharge status: Home / Self Care | Source: Ambulatory Visit | Attending: Internal Medicine | Admitting: Internal Medicine

## 2022-03-19 DIAGNOSIS — R7989 Other specified abnormal findings of blood chemistry: Secondary | ICD-10-CM | POA: Diagnosis present

## 2022-06-19 DIAGNOSIS — E119 Type 2 diabetes mellitus without complications: Secondary | ICD-10-CM

## 2022-06-19 HISTORY — DX: Type 2 diabetes mellitus without complications: E11.9

## 2022-07-06 ENCOUNTER — Ambulatory Visit: Payer: Self-pay | Admitting: Surgery

## 2022-07-06 NOTE — H&P (View-Only) (Signed)
Subjective:   CC: Genital warts [A63.0]  HPI:  Alan Day is a 41 y.o. male who was referred by [facility] for evaluation of above. First noted several years ago.  Lasers and creams have never fully resolved issues.  Never had rectal exam performed.  Bigger lesions cause itching, bleeding occasionally.   Past Medical History:  has a past medical history of Anxiety, Clavicular fracture, History of tinnitus, Lyme disease, Migraines, and Sleep apnea.  Past Surgical History:  has a past surgical history that includes Sinus surgery; Skin biopsy; and dental extraction (Left).  Family History: family history includes Cancer in his paternal grandmother; Diabetes in his father and paternal grandfather; Hearing loss in his mother.  Social History:  reports that he quit smoking about 11 months ago. His smoking use included cigarettes. He has a 4.00 pack-year smoking history. He has never used smokeless tobacco. He reports current alcohol use. Drug use questions deferred to the physician.  Current Medications: has a current medication list which includes the following prescription(s): bisoprolol-hydrochlorothiazide, ergocalciferol (vitamin d2), ibuprofen, podofilox, and semaglutide.  Allergies:  No Known Allergies  ROS:  A 15 point review of systems was performed and pertinent positives and negatives noted in HPI   Objective:     BP 133/84   Pulse 69   Ht 193 cm (6\' 4" )   Wt (!) 141.5 kg (312 lb)   BMI 37.98 kg/m   Constitutional :  No distress, cooperative, alert  Lymphatics/Throat:  Supple with no lymphadenopathy  Respiratory:  Clear to auscultation bilaterally  Cardiovascular:  Regular rate and rhythm  Gastrointestinal: Soft, non-tender, non-distended, no organomegaly.  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  Psychiatric: Normal affect, non-agitated, not confused          LABS:  N/a   RADS: N/a  Assessment:      Genital warts [A63.0]  Plan:     1. Genital warts [A63.0] Discussed surgical excision and EUA to ensure no persistent lesions in rectum as cause of persistence.  Alternatives include continued observation and medical management.  Benefits include possible symptom relief, pathologic evaluation, improved cosmesis.  Discussed the risk of surgery including recurrence, chronic pain, post-op infxn, poor cosmesis, poor/delayed wound healing, and possible re-operation to address said risks. The risks of general anesthetic, if used, includes MI, CVA, sudden death or even reaction to anesthetic medications also discussed.   Typical post-op recovery time of 3-5 days with possible activity restrictions were also discussed.  The patient verbalized understanding and all questions were answered to the patient's satisfaction.  2. Patient has elected to proceed with surgical treatment. Procedure will be scheduled.  labs/images/medications/previous chart entries reviewed personally and relevant changes/updates noted above.

## 2022-07-06 NOTE — H&P (Signed)
Subjective:   CC: Genital warts [A63.0]  HPI:  Alan Day is a 41 y.o. male who was referred by [facility] for evaluation of above. First noted several years ago.  Lasers and creams have never fully resolved issues.  Never had rectal exam performed.  Bigger lesions cause itching, bleeding occasionally.   Past Medical History:  has a past medical history of Anxiety, Clavicular fracture, History of tinnitus, Lyme disease, Migraines, and Sleep apnea.  Past Surgical History:  has a past surgical history that includes Sinus surgery; Skin biopsy; and dental extraction (Left).  Family History: family history includes Cancer in his paternal grandmother; Diabetes in his father and paternal grandfather; Hearing loss in his mother.  Social History:  reports that he quit smoking about 11 months ago. His smoking use included cigarettes. He has a 4.00 pack-year smoking history. He has never used smokeless tobacco. He reports current alcohol use. Drug use questions deferred to the physician.  Current Medications: has a current medication list which includes the following prescription(s): bisoprolol-hydrochlorothiazide, ergocalciferol (vitamin d2), ibuprofen, podofilox, and semaglutide.  Allergies:  No Known Allergies  ROS:  A 15 point review of systems was performed and pertinent positives and negatives noted in HPI   Objective:     BP 133/84   Pulse 69   Ht 193 cm (6' 4")   Wt (!) 141.5 kg (312 lb)   BMI 37.98 kg/m   Constitutional :  No distress, cooperative, alert  Lymphatics/Throat:  Supple with no lymphadenopathy  Respiratory:  Clear to auscultation bilaterally  Cardiovascular:  Regular rate and rhythm  Gastrointestinal: Soft, non-tender, non-distended, no organomegaly.  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  Psychiatric: Normal affect, non-agitated, not confused          LABS:  N/a   RADS: N/a  Assessment:      Genital warts [A63.0]  Plan:     1. Genital warts [A63.0] Discussed surgical excision and EUA to ensure no persistent lesions in rectum as cause of persistence.  Alternatives include continued observation and medical management.  Benefits include possible symptom relief, pathologic evaluation, improved cosmesis.  Discussed the risk of surgery including recurrence, chronic pain, post-op infxn, poor cosmesis, poor/delayed wound healing, and possible re-operation to address said risks. The risks of general anesthetic, if used, includes MI, CVA, sudden death or even reaction to anesthetic medications also discussed.   Typical post-op recovery time of 3-5 days with possible activity restrictions were also discussed.  The patient verbalized understanding and all questions were answered to the patient's satisfaction.  2. Patient has elected to proceed with surgical treatment. Procedure will be scheduled.  labs/images/medications/previous chart entries reviewed personally and relevant changes/updates noted above.  

## 2022-07-09 ENCOUNTER — Encounter
Admission: RE | Admit: 2022-07-09 | Discharge: 2022-07-09 | Disposition: A | Discharge status: Home / Self Care | Source: Ambulatory Visit | Attending: Surgery | Admitting: Surgery

## 2022-07-09 ENCOUNTER — Other Ambulatory Visit: Payer: Self-pay

## 2022-07-09 ENCOUNTER — Other Ambulatory Visit

## 2022-07-09 HISTORY — DX: Headache, unspecified: R51.9

## 2022-07-09 HISTORY — DX: Anxiety disorder, unspecified: F41.9

## 2022-07-09 HISTORY — DX: Anemia, unspecified: D64.9

## 2022-07-09 HISTORY — DX: Sleep apnea, unspecified: G47.30

## 2022-07-09 NOTE — Patient Instructions (Addendum)
Your procedure is scheduled on: 07/10/22 - Friday Report to the Registration Desk on the 1st floor of the [facility]. To find out your arrival time, please call [phone] between 1PM - 3PM on: 07/09/22 - Thursday If your arrival time is 6:00 am, do not arrive prior to that time as the [facility] entrance doors do not open until 6:00 am.  REMEMBER: Instructions that are not followed completely may result in serious medical risk, up to and including death; or upon the discretion of your surgeon and anesthesiologist your surgery may need to be rescheduled.  Do not eat food after midnight the night before surgery.  No gum chewing, lozengers or hard candies.  You may however, drink CLEAR liquids up to 2 hours before you are scheduled to arrive for your surgery. Do not drink anything within 2 hours of your scheduled arrival time.  Clear liquids include: - water  - apple juice without pulp - gatorade (not RED colors) - black coffee or tea (Do NOT add milk or creamers to the coffee or tea) Do NOT drink anything that is not on this list.  TAKE THESE MEDICATIONS THE MORNING OF SURGERY WITH A SIP OF WATER: NONE  One week prior to surgery: Stop Anti-inflammatories (NSAIDS) such as Advil, Aleve, Ibuprofen, Motrin, Naproxen, Naprosyn and Aspirin based products such as Excedrin, Goodys Powder, BC Powder.  Stop ANY OVER THE COUNTER supplements until after surgery.  You may however, continue to take Tylenol if needed for pain up until the day of surgery.  No Alcohol for 24 hours before or after surgery.  No Smoking including e-cigarettes for 24 hours prior to surgery.  No chewable tobacco products for at least 6 hours prior to surgery.  No nicotine patches on the day of surgery.  Do not use any "recreational" drugs for at least a week prior to your surgery.  Please be advised that the combination of cocaine and anesthesia may have negative outcomes, up to and including death. If you test  positive for cocaine, your surgery will be cancelled.  On the morning of surgery brush your teeth with toothpaste and water, you may rinse your mouth with mouthwash if you wish. Do not swallow any toothpaste or mouthwash.  Use CHG Soap or wipes as directed on instruction sheet.  Do not wear jewelry, make-up, hairpins, clips or nail polish.  Do not wear lotions, powders, or perfumes.   Do not shave body from the neck down 48 hours prior to surgery just in case you cut yourself which could leave a site for infection.  Also, freshly shaved skin may become irritated if using the CHG soap.  Contact lenses, hearing aids and dentures may not be worn into surgery.  Do not bring valuables to the hospital. [facility] is not responsible for any missing/lost belongings or valuables.   Bring your C-PAP to the hospital with you in case you may have to spend the night.   Notify your doctor if there is any change in your medical condition (cold, fever, infection).  Wear comfortable clothing (specific to your surgery type) to the hospital.  After surgery, you can help prevent lung complications by doing breathing exercises.  Take deep breaths and cough every 1-2 hours. Your doctor may order a device called an Incentive Spirometer to help you take deep breaths. When coughing or sneezing, hold a pillow firmly against your incision with both hands. This is called "splinting." Doing this helps protect your incision. It also  decreases belly discomfort.  If you are being admitted to the hospital overnight, leave your suitcase in the car. After surgery it may be brought to your room.  If you are being discharged the day of surgery, you will not be allowed to drive home. You will need a responsible adult (18 years or older) to drive you home and stay with you that night.   If you are taking public transportation, you will need to have a responsible adult (18 years or older) with you. Please confirm with  your physician that it is acceptable to use public transportation.   Please call the Pre-admissions Testing Dept. at [phone] if you have any questions about these instructions.  Surgery Visitation Policy:  Patients undergoing a surgery or procedure may have two family members or support persons with them as long as the person is not COVID-19 positive or experiencing its symptoms.   Inpatient Visitation:    Visiting hours are 7 a.m. to 8 p.m. Up to four visitors are allowed at one time in a patient room, including children. The visitors may rotate out with other people during the day. One designated support person (adult) may remain overnight.

## 2022-07-10 ENCOUNTER — Ambulatory Visit
Admission: RE | Admit: 2022-07-10 | Discharge: 2022-07-10 | Disposition: A | Discharge status: Home / Self Care | Attending: Surgery | Admitting: Surgery

## 2022-07-10 ENCOUNTER — Other Ambulatory Visit: Payer: Self-pay

## 2022-07-10 ENCOUNTER — Encounter
Admission: RE | Disposition: A | Discharge status: Home / Self Care | Payer: Self-pay | Source: Home / Self Care | Attending: Surgery

## 2022-07-10 ENCOUNTER — Ambulatory Visit: Admitting: Anesthesiology

## 2022-07-10 ENCOUNTER — Encounter: Payer: Self-pay | Admitting: Surgery

## 2022-07-10 DIAGNOSIS — Z6837 Body mass index (BMI) 37.0-37.9, adult: Secondary | ICD-10-CM | POA: Insufficient documentation

## 2022-07-10 DIAGNOSIS — E669 Obesity, unspecified: Secondary | ICD-10-CM | POA: Insufficient documentation

## 2022-07-10 DIAGNOSIS — Z87891 Personal history of nicotine dependence: Secondary | ICD-10-CM | POA: Diagnosis not present

## 2022-07-10 DIAGNOSIS — A630 Anogenital (venereal) warts: Secondary | ICD-10-CM | POA: Diagnosis present

## 2022-07-10 DIAGNOSIS — E119 Type 2 diabetes mellitus without complications: Secondary | ICD-10-CM | POA: Diagnosis not present

## 2022-07-10 DIAGNOSIS — G4730 Sleep apnea, unspecified: Secondary | ICD-10-CM | POA: Insufficient documentation

## 2022-07-10 DIAGNOSIS — I10 Essential (primary) hypertension: Secondary | ICD-10-CM | POA: Insufficient documentation

## 2022-07-10 HISTORY — PX: RECTAL EXAM UNDER ANESTHESIA: SHX6399

## 2022-07-10 LAB — GLUCOSE, CAPILLARY
Glucose-Capillary: 105 mg/dL — ABNORMAL HIGH (ref 70–99)
Glucose-Capillary: 98 mg/dL (ref 70–99)

## 2022-07-10 SURGERY — EXAM UNDER ANESTHESIA, RECTUM
Anesthesia: General | Site: Rectum

## 2022-07-10 MED ORDER — LIDOCAINE HCL (CARDIAC) PF 100 MG/5ML IV SOSY
PREFILLED_SYRINGE | INTRAVENOUS | Status: DC | PRN
  Administered 2022-07-10: 100 mg via INTRAVENOUS

## 2022-07-10 MED ORDER — CHLORHEXIDINE GLUCONATE CLOTH 2 % EX PADS: 6.0000 | MEDICATED_PAD | Freq: Once | CUTANEOUS | Status: DC

## 2022-07-10 MED ORDER — MIDAZOLAM HCL 2 MG/2ML IJ SOLN
INTRAMUSCULAR | Status: AC
  Filled 2022-07-10: qty 2

## 2022-07-10 MED ORDER — FENTANYL CITRATE (PF) 100 MCG/2ML IJ SOLN
INTRAMUSCULAR | Status: DC | PRN
  Administered 2022-07-10: 50 ug via INTRAVENOUS

## 2022-07-10 MED ORDER — ONDANSETRON HCL 4 MG/2ML IJ SOLN
INTRAMUSCULAR | Status: DC | PRN
  Administered 2022-07-10: 4 mg via INTRAVENOUS

## 2022-07-10 MED ORDER — PROPOFOL 10 MG/ML IV BOLUS
INTRAVENOUS | Status: AC
  Filled 2022-07-10: qty 20

## 2022-07-10 MED ORDER — LIDOCAINE 5 % EX OINT: 1.0000 | TOPICAL_OINTMENT | Freq: Three times a day (TID) | CUTANEOUS | 0 refills | Status: AC | PRN

## 2022-07-10 MED ORDER — FAMOTIDINE 20 MG PO TABS: 20.0000 mg | ORAL_TABLET | Freq: Once | ORAL | Status: AC

## 2022-07-10 MED ORDER — OXYCODONE-ACETAMINOPHEN 5-325 MG PO TABS: 1.0000 | ORAL_TABLET | Freq: Three times a day (TID) | ORAL | 0 refills | Status: AC | PRN

## 2022-07-10 MED ORDER — IBUPROFEN 800 MG PO TABS: 800.0000 mg | ORAL_TABLET | Freq: Three times a day (TID) | ORAL | 0 refills | Status: AC | PRN

## 2022-07-10 MED ORDER — CHLORHEXIDINE GLUCONATE 0.12 % MT SOLN
OROMUCOSAL | Status: AC
  Administered 2022-07-10: 15 mL via OROMUCOSAL
  Filled 2022-07-10: qty 15

## 2022-07-10 MED ORDER — BACITRACIN ZINC 500 UNIT/GM EX OINT
TOPICAL_OINTMENT | CUTANEOUS | Status: DC | PRN
  Administered 2022-07-10: 1 via TOPICAL

## 2022-07-10 MED ORDER — BUPIVACAINE-EPINEPHRINE (PF) 0.5% -1:200000 IJ SOLN
INTRAMUSCULAR | Status: DC | PRN
  Administered 2022-07-10: 10 mL

## 2022-07-10 MED ORDER — LIDOCAINE HCL (PF) 2 % IJ SOLN
INTRAMUSCULAR | Status: AC
  Filled 2022-07-10: qty 5

## 2022-07-10 MED ORDER — ONDANSETRON HCL 4 MG/2ML IJ SOLN
INTRAMUSCULAR | Status: AC
  Filled 2022-07-10: qty 2

## 2022-07-10 MED ORDER — KETAMINE HCL 50 MG/5ML IJ SOSY
PREFILLED_SYRINGE | INTRAMUSCULAR | Status: AC
  Filled 2022-07-10: qty 5

## 2022-07-10 MED ORDER — BACITRACIN ZINC 500 UNIT/GM EX OINT
TOPICAL_OINTMENT | CUTANEOUS | Status: AC
  Filled 2022-07-10: qty 28.35

## 2022-07-10 MED ORDER — CELECOXIB 200 MG PO CAPS
ORAL_CAPSULE | ORAL | Status: AC
  Administered 2022-07-10: 200 mg via ORAL
  Filled 2022-07-10: qty 1

## 2022-07-10 MED ORDER — LACTATED RINGERS IV SOLN: INTRAVENOUS | Status: DC

## 2022-07-10 MED ORDER — ORAL CARE MOUTH RINSE: 15.0000 mL | Freq: Once | OROMUCOSAL | Status: AC

## 2022-07-10 MED ORDER — OXYCODONE HCL 5 MG/5ML PO SOLN: 5.0000 mg | Freq: Once | ORAL | Status: DC | PRN

## 2022-07-10 MED ORDER — GLYCOPYRROLATE 0.2 MG/ML IJ SOLN
INTRAMUSCULAR | Status: AC
  Filled 2022-07-10: qty 1

## 2022-07-10 MED ORDER — FENTANYL CITRATE (PF) 100 MCG/2ML IJ SOLN: 25.0000 ug | INTRAMUSCULAR | Status: DC | PRN

## 2022-07-10 MED ORDER — DOCUSATE SODIUM 100 MG PO CAPS: 100.0000 mg | ORAL_CAPSULE | Freq: Two times a day (BID) | ORAL | 0 refills | Status: AC | PRN

## 2022-07-10 MED ORDER — ONDANSETRON HCL 4 MG/2ML IJ SOLN: 4.0000 mg | Freq: Once | INTRAMUSCULAR | Status: DC | PRN

## 2022-07-10 MED ORDER — BUPIVACAINE LIPOSOME 1.3 % IJ SUSP
INTRAMUSCULAR | Status: AC
  Filled 2022-07-10: qty 20

## 2022-07-10 MED ORDER — GABAPENTIN 300 MG PO CAPS
ORAL_CAPSULE | ORAL | Status: AC
  Administered 2022-07-10: 300 mg via ORAL
  Filled 2022-07-10: qty 1

## 2022-07-10 MED ORDER — ACETAMINOPHEN 325 MG PO TABS: 650.0000 mg | ORAL_TABLET | Freq: Three times a day (TID) | ORAL | 0 refills | Status: AC | PRN

## 2022-07-10 MED ORDER — BUPIVACAINE-EPINEPHRINE (PF) 0.5% -1:200000 IJ SOLN
INTRAMUSCULAR | Status: AC
  Filled 2022-07-10: qty 30

## 2022-07-10 MED ORDER — CELECOXIB 200 MG PO CAPS: 200.0000 mg | ORAL_CAPSULE | ORAL | Status: AC

## 2022-07-10 MED ORDER — CEFAZOLIN SODIUM-DEXTROSE 2-4 GM/100ML-% IV SOLN
2.0000 g | INTRAVENOUS | Status: AC
  Administered 2022-07-10: 2 g via INTRAVENOUS

## 2022-07-10 MED ORDER — CHLORHEXIDINE GLUCONATE 0.12 % MT SOLN: 15.0000 mL | Freq: Once | OROMUCOSAL | Status: AC

## 2022-07-10 MED ORDER — MIDAZOLAM HCL 2 MG/2ML IJ SOLN
INTRAMUSCULAR | Status: DC | PRN
  Administered 2022-07-10: 2 mg via INTRAVENOUS

## 2022-07-10 MED ORDER — FAMOTIDINE 20 MG PO TABS
ORAL_TABLET | ORAL | Status: AC
  Administered 2022-07-10: 20 mg via ORAL
  Filled 2022-07-10: qty 1

## 2022-07-10 MED ORDER — TRIPLE ANTIBIOTIC 3.5-400-5000 EX OINT
TOPICAL_OINTMENT | CUTANEOUS | Status: AC
  Filled 2022-07-10: qty 1

## 2022-07-10 MED ORDER — OXYCODONE HCL 5 MG PO TABS: 5.0000 mg | ORAL_TABLET | Freq: Once | ORAL | Status: DC | PRN

## 2022-07-10 MED ORDER — ACETAMINOPHEN 500 MG PO TABS: 1000.0000 mg | ORAL_TABLET | ORAL | Status: AC

## 2022-07-10 MED ORDER — CEFAZOLIN SODIUM-DEXTROSE 2-4 GM/100ML-% IV SOLN
INTRAVENOUS | Status: AC
  Filled 2022-07-10: qty 100

## 2022-07-10 MED ORDER — GABAPENTIN 300 MG PO CAPS: 300.0000 mg | ORAL_CAPSULE | ORAL | Status: AC

## 2022-07-10 MED ORDER — FENTANYL CITRATE (PF) 100 MCG/2ML IJ SOLN
INTRAMUSCULAR | Status: AC
  Filled 2022-07-10: qty 2

## 2022-07-10 MED ORDER — PROPOFOL 10 MG/ML IV BOLUS
INTRAVENOUS | Status: AC
  Filled 2022-07-10: qty 40

## 2022-07-10 MED ORDER — PROPOFOL 500 MG/50ML IV EMUL
INTRAVENOUS | Status: DC | PRN
  Administered 2022-07-10: 80 ug/kg/min via INTRAVENOUS

## 2022-07-10 MED ORDER — KETAMINE HCL 10 MG/ML IJ SOLN
INTRAMUSCULAR | Status: DC | PRN
  Administered 2022-07-10: 30 mg via INTRAVENOUS
  Administered 2022-07-10: 20 mg via INTRAVENOUS

## 2022-07-10 MED ORDER — DEXAMETHASONE SODIUM PHOSPHATE 10 MG/ML IJ SOLN
INTRAMUSCULAR | Status: DC | PRN
  Administered 2022-07-10: 5 mg via INTRAVENOUS

## 2022-07-10 MED ORDER — DEXAMETHASONE SODIUM PHOSPHATE 10 MG/ML IJ SOLN
INTRAMUSCULAR | Status: AC
  Filled 2022-07-10: qty 1

## 2022-07-10 MED ORDER — ACETAMINOPHEN 500 MG PO TABS
ORAL_TABLET | ORAL | Status: AC
  Administered 2022-07-10: 1000 mg via ORAL
  Filled 2022-07-10: qty 2

## 2022-07-10 MED ORDER — GLYCOPYRROLATE 0.2 MG/ML IJ SOLN
INTRAMUSCULAR | Status: DC | PRN
  Administered 2022-07-10: .1 mg via INTRAVENOUS

## 2022-07-10 SURGICAL SUPPLY — 27 items
BLADE SURG 15 STRL LF DISP TIS (BLADE) ×1 IMPLANT
BLADE SURG 15 STRL SS (BLADE) ×1
BRIEF MESH DISP 2XL (UNDERPADS AND DIAPERS) ×1 IMPLANT
DRAPE PERI LITHO V/GYN (MISCELLANEOUS) ×1 IMPLANT
DRSG GAUZE FLUFF 36X18 (GAUZE/BANDAGES/DRESSINGS) ×1 IMPLANT
ELECT REM PT RETURN 9FT ADLT (ELECTROSURGICAL) ×1
ELECTRODE REM PT RTRN 9FT ADLT (ELECTROSURGICAL) ×1 IMPLANT
GAUZE 4X4 16PLY ~~LOC~~+RFID DBL (SPONGE) ×1 IMPLANT
GLOVE BIOGEL PI IND STRL 7.0 (GLOVE) ×1 IMPLANT
GLOVE SURG SYN 6.5 ES PF (GLOVE) ×1 IMPLANT
GLOVE SURG SYN 6.5 PF PI (GLOVE) ×1 IMPLANT
GOWN STRL REUS W/ TWL LRG LVL3 (GOWN DISPOSABLE) ×2 IMPLANT
GOWN STRL REUS W/TWL LRG LVL3 (GOWN DISPOSABLE) ×2
KIT TURNOVER CYSTO (KITS) ×1 IMPLANT
LABEL OR SOLS (LABEL) ×1 IMPLANT
MANIFOLD NEPTUNE II (INSTRUMENTS) ×1 IMPLANT
NDL HYPO 25X1 1.5 SAFETY (NEEDLE) ×1 IMPLANT
NDL SAFETY ECLIP 18X1.5 (MISCELLANEOUS) ×1 IMPLANT
NEEDLE HYPO 25X1 1.5 SAFETY (NEEDLE) ×1 IMPLANT
NS IRRIG 500ML POUR BTL (IV SOLUTION) ×1 IMPLANT
PACK BASIN MINOR ARMC (MISCELLANEOUS) ×1 IMPLANT
SOL PREP PVP 2OZ (MISCELLANEOUS) ×1
SOLUTION PREP PVP 2OZ (MISCELLANEOUS) ×1 IMPLANT
SURGILUBE 2OZ TUBE FLIPTOP (MISCELLANEOUS) ×1 IMPLANT
SYR 10ML LL (SYRINGE) ×2 IMPLANT
TOWEL OR 17X26 4PK STRL BLUE (TOWEL DISPOSABLE) ×1 IMPLANT
TRAP FLUID SMOKE EVACUATOR (MISCELLANEOUS) ×1 IMPLANT

## 2022-07-10 NOTE — Anesthesia Preprocedure Evaluation (Signed)
Anesthesia Evaluation  Patient identified by MRN, date of birth, ID band Patient awake    Reviewed: Allergy & Precautions, NPO status , Patient's Chart, lab work & pertinent test results  History of Anesthesia Complications Negative for: history of anesthetic complications  Airway Mallampati: II  TM Distance: >3 FB Neck ROM: Full    Dental no notable dental hx. (+) Teeth Intact   Pulmonary sleep apnea and Continuous Positive Airway Pressure Ventilation , neg COPD, Patient abstained from smoking.Not current smoker, former smoker,    Pulmonary exam normal breath sounds clear to auscultation       Cardiovascular Exercise Tolerance: Good METShypertension, Pt. on medications (-) CAD and (-) Past MI (-) dysrhythmias  Rhythm:Regular Rate:Normal - Systolic murmurs    Neuro/Psych  Headaches, PSYCHIATRIC DISORDERS Anxiety    GI/Hepatic neg GERD  ,(+)     (-) substance abuse  ,   Endo/Other  diabetesNewly diagnosed with diabetes, not on meds yet  Renal/GU negative Renal ROS     Musculoskeletal   Abdominal (+) + obese,   Peds  Hematology   Anesthesia Other Findings Past Medical History: No date: Anemia No date: Anxiety 06/2022: Diabetes mellitus without complication (HCC)     Comment:  type 2 No date: Headache No date: Hypertension No date: Sleep apnea  Reproductive/Obstetrics                             Anesthesia Physical Anesthesia Plan  ASA: 2  Anesthesia Plan: General   Post-op Pain Management: Minimal or no pain anticipated, Tylenol PO (pre-op)*, Celebrex PO (pre-op)* and Gabapentin PO (pre-op)*   Induction: Intravenous  PONV Risk Score and Plan: 2 and Propofol infusion, TIVA, Ondansetron and Midazolam  Airway Management Planned: Nasal Cannula  Additional Equipment: None  Intra-op Plan:   Post-operative Plan:   Informed Consent: I have reviewed the patients History and  Physical, chart, labs and discussed the procedure including the risks, benefits and alternatives for the proposed anesthesia with the patient or authorized representative who has indicated his/her understanding and acceptance.     Dental advisory given  Plan Discussed with: CRNA and Surgeon  Anesthesia Plan Comments: (Discussed risks of anesthesia with patient, including possibility of difficulty with spontaneous ventilation under anesthesia necessitating airway intervention, PONV, and rare risks such as cardiac or respiratory or neurological events, and allergic reactions. Discussed the role of CRNA in patient's perioperative care. Patient understands.)        Anesthesia Quick Evaluation

## 2022-07-10 NOTE — Interval H&P Note (Signed)
No change. OK to proceed.

## 2022-07-10 NOTE — Op Note (Addendum)
L preoperative diagnosis: genital warts Postoperative diagnosis: Same  Procedure: Exam under anesthesia, anal condyloma fulguration x20  Anesthesia: LMA  Surgeon: Lysle Pearl  Wound Classification: Clean Contaminated  Specimen: None  Complications: None  EBL: Minimal  Indications:  See H&P for further details.  Description of procedure: The patient was taken to the operating room and placed in high lithotomy position.  LMA anesthesia was induced without any difficulty. A time-out was completed verifying correct patient, procedure, site, positioning, and implant(s) and/or special equipment prior to beginning this procedure.  Digital rectal exam noted no obvious indurated tissue or palpable lesions.  He did have some grade 1 internal hemorrhoids.  Speculum placed within rectal vault to fully visualize and above findings confirmed.  Exam under anesthesia completed at this time.  Attention turned to the visible condyloma lesions around the perineum and perianal area, majority on the patient left, and a couple on the patient right.  3 out of the 20 lesions were sent for pathology.  The remaining lesions varying in size from 2 mm to 1.3 cm was all either cauterized or transected at the base using electrocautery.  Local anesthesia was was induced in the surrounding areas prior to initiation of fulguration.  After confirming majority of the visible pedunculated lesions were treated, and no pathology noted, area was covered with Neosporin, 4 x 4 fluffs, secured with mesh undergarment.  The patient tolerated the procedure well and  taken to the postoperative care unit in stable condition.  Sponge and instrument count was correct at the end of the procedure.

## 2022-07-10 NOTE — Transfer of Care (Signed)
Immediate Anesthesia Transfer of Care Note  Patient: Audrie Lia  Procedure(s) Performed: RECTAL EXAM UNDER ANESTHESIA (Rectum)  Patient Location: PACU  Anesthesia Type:General  Level of Consciousness: drowsy  Airway & Oxygen Therapy: Patient Spontanous Breathing and Patient connected to face mask oxygen  Post-op Assessment: Report given to RN and Post -op Vital signs reviewed and stable  Post vital signs: Reviewed and stable  Last Vitals:  Vitals Value Taken Time  BP 130/72 07/10/22 1115  Temp 36.4 C 07/10/22 1115  Pulse 62 07/10/22 1120  Resp 15 07/10/22 1120  SpO2 100 % 07/10/22 1120  Vitals shown include unvalidated device data.  Last Pain:  Vitals:   07/10/22 0916  TempSrc: Temporal  PainSc: 0-No pain         Complications: No notable events documented.

## 2022-07-10 NOTE — Discharge Instructions (Addendum)
AMBULATORY SURGERY  DISCHARGE INSTRUCTIONS   Removal, Care After This sheet gives you information about how to care for yourself after your procedure. Your health care provider may also give you more specific instructions. If you have problems or questions, contact your health care provider. What can I expect after the procedure? After the procedure, it is common to have: Soreness. Bruising. Itching. Follow these instructions at home: site care Follow instructions from your health care provider about how to take care of your site. Make sure you: Wash your hands with soap and water before and after you change your bandage (dressing). If soap and water are not available, use hand sanitizer. If the area bleeds or bruises, apply gentle pressure for 10 minutes. OK TO SHOWER IN 24HRS REMOVE DRESSING IN 24HRS AND APPLY NEOSPORIN TO AREA DAILY.  OPEN 4X4 GAUZE AND FLUFF THEM AND SECURE TO AREA WITH UNDERGARMENTS HOLDING IN PLACE.  CHANGE DRESSING DAILY. CONTINUE FOR AT LEAST 5 DAYS IF POSSIBLE.  IF AREA CONTINUES TO HEAL WELL, THEN OK TO SWITCH TO CLOTHING THAT IS NOT TOO TIGHT AROUND AREAS  Check your site every day for signs of infection. Check for: Redness, swelling, or pain. Fluid or blood. Warmth. Pus or a bad smell.  General instructions Lidocaine cream as needed Neosporin daily to area. Rest and then return to your normal activities as told by your health care provider.  tylenol and advil as needed for discomfort.  Please alternate between the two every four hours as needed for pain.    Use narcotics, if prescribed, only when tylenol and motrin is not enough to control pain.  325-650mg  every 8hrs to max of 3000mg /24hrs (including the 325mg  in every norco dose) for the tylenol.    Advil up to 800mg  per dose every 8hrs as needed for pain.   Keep all follow-up visits as told by your health care provider. This is important. Contact a health care provider if: You have redness, swelling,  or pain around your site. You have fluid or blood coming from your site. Your site feels warm to the touch. You have pus or a bad smell coming from your site. You have a fever. Your sutures, skin glue, or adhesive strips loosen or come off sooner than expected. Get help right away if: You have bleeding that does not stop with pressure or a dressing. Summary After the procedure, it is common to have some soreness, bruising, and itching at the site. Follow instructions from your health care provider about how to take care of your site. Check your site every day for signs of infection. Contact a health care provider if you have redness, swelling, or pain around your site, or your site feels warm to the touch. Keep all follow-up visits as told by your health care provider. This is important. This information is not intended to replace advice given to you by your health care provider. Make sure you discuss any questions you have with your health care provider. Document Released: 11/01/2015 Document Revised: 04/04/2018 Document Reviewed: 04/04/2018 Elsevier Interactive Patient Education  [employer].    The drugs that you were given will stay in your system until tomorrow so for the next 24 hours you should not:  Drive an automobile Make any legal decisions Drink any alcoholic beverage   You may resume regular meals tomorrow.  Today it is better to start with liquids and gradually work up to solid foods.  You may eat anything you prefer, but it  is better to start with liquids, then soup and crackers, and gradually work up to solid foods.   Please notify your doctor immediately if you have any unusual bleeding, trouble breathing, redness and pain at the surgery site, drainage, fever, or pain not relieved by medication.     Your post-operative visit with Dr.                                       is: Date:                        Time:    Please call to schedule your  post-operative visit.  Additional Instructions:

## 2022-07-10 NOTE — Anesthesia Postprocedure Evaluation (Signed)
Anesthesia Post Note  Patient: Alan Day  Procedure(s) Performed: RECTAL EXAM UNDER ANESTHESIA (Rectum)  Patient location during evaluation: PACU Anesthesia Type: General Level of consciousness: awake and alert Pain management: pain level controlled Vital Signs Assessment: post-procedure vital signs reviewed and stable Respiratory status: spontaneous breathing, nonlabored ventilation, respiratory function stable and patient connected to nasal cannula oxygen Cardiovascular status: blood pressure returned to baseline and stable Postop Assessment: no apparent nausea or vomiting Anesthetic complications: no   No notable events documented.   Last Vitals:  Vitals:   07/10/22 1200 07/10/22 1219  BP: 133/83 126/77  Pulse: (!) 50 (!) 56  Resp: (!) 21 16  Temp: (!) 36.3 C 36.5 C  SpO2: 99% 100%    Last Pain:  Vitals:   07/10/22 1219  TempSrc: Temporal  PainSc: 0-No pain                 Arita Miss

## 2022-07-11 ENCOUNTER — Encounter: Payer: Self-pay | Admitting: Surgery

## 2022-07-13 LAB — SURGICAL PATHOLOGY

## 2024-10-05 ENCOUNTER — Ambulatory Visit: Admission: EM | Admit: 2024-10-05 | Discharge: 2024-10-05 | Disposition: A | Discharge status: Home / Self Care

## 2024-10-05 DIAGNOSIS — B349 Viral infection, unspecified: Secondary | ICD-10-CM

## 2024-10-05 DIAGNOSIS — J020 Streptococcal pharyngitis: Secondary | ICD-10-CM

## 2024-10-05 LAB — POCT RAPID STREP A (OFFICE): Rapid Strep A Screen: POSITIVE — AB

## 2024-10-05 MED ORDER — AMOXICILLIN 500 MG PO CAPS: 500.0000 mg | ORAL_CAPSULE | Freq: Two times a day (BID) | ORAL | 0 refills | Status: AC

## 2024-10-05 NOTE — ED Triage Notes (Signed)
 Patient to Urgent Care with complaints of  cough/ sore throat/ drainage/ SHOB/ body aches.  Symptoms x3 days. Patient was swabbed for strep two days ago that was negative.  Taking benzonatate / mucinex- little relief.

## 2024-10-05 NOTE — Discharge Instructions (Addendum)
 The strep test is positive.  COVID test is negative.   Take the amoxicillin  as directed for strep throat.    Take Tylenol  or ibuprofen  as needed for fever or discomfort.    Follow-up with your primary care provider if your symptoms are not improving.

## 2024-10-05 NOTE — ED Provider Notes (Signed)
 Alan Day    CSN: [id number] Arrival date & time: 10/05/24  9164      History   Chief Complaint Chief Complaint  Patient presents with   Sore Throat   Cough   Nasal Congestion    HPI Alan Day is a 43 y.o. male.  Patient presents with 3-day history of bodyaches, sore throat, postnasal drainage, cough.  No shortness of breath, vomiting, diarrhea.  He has been treating his symptoms with Tessalon  Perles and Mucinex.  Patient was seen by his PCP on 10/03/2024; diagnosed with symptoms of upper respiratory infection; strep negative; treated with Tessalon  Perles.   The history is provided by the patient and medical records.    Past Medical History:  Diagnosis Date   Anemia    Anxiety    Diabetes mellitus without complication (HCC) 06/2022   type 2   Headache    Hypertension    Sleep apnea     Patient Active Problem List   Diagnosis Date Noted   Anxiety 02/08/2022   Dermatophytosis of groin and perianal area 02/08/2022   Generalized anxiety disorder 02/08/2022   Lyme disease 02/08/2022   Migraines 02/08/2022   Encounters for administrative purpose 02/08/2022   Myopia 02/08/2022   Nicotine dependence 02/08/2022   Open wound of ankle 02/08/2022   Open wound of foot 02/08/2022   Pain in joint, lower leg 02/08/2022   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   Patellofemoral syndrome 02/08/2022   Rash and other nonspecific skin eruption 02/08/2022   Regular astigmatism 02/08/2022   Scleritis 02/08/2022   Snoring 02/08/2022   Sprain of right ankle 01/11/2022   Vitamin D deficiency 04/16/2020   OSA on CPAP 09/20/2019   Abnormal LFTs (liver function tests) 10/27/2018   Condyloma acuminatum of perianal region 05/11/2017   Condyloma of male genitalia 05/11/2017   Status post vasectomy 05/11/2017   Encounter for sterilization 01/21/2017   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   Consultation for sterilization 12/24/2016   Unspecified acute conjunctivitis, right eye  10/30/2016   Nasal valve stenosis 12/19/2014   Chronic anemia 12/10/2014   Hypertriglyceridemia 12/10/2014   Deviated nasal septum 06/20/2014   Postnasal drip 06/20/2014    Past Surgical History:  Procedure Laterality Date   RECTAL EXAM UNDER ANESTHESIA N/A 07/10/2022   Procedure: RECTAL EXAM UNDER ANESTHESIA;  Surgeon: Tye Millet, DO;  Location: [facility] ORS;  Service: General;  Laterality: N/A;   SEPTOPLASTY     WISDOM TOOTH EXTRACTION         Home Medications    Prior to Admission medications  Medication Sig Start Date End Date Taking? Authorizing Provider  amLODipine (NORVASC) 5 MG tablet Take 5 mg by mouth every evening. 06/14/24 06/14/25 Yes [provider]  amoxicillin  (AMOXIL ) 500 MG capsule Take 1 capsule (500 mg total) by mouth 2 (two) times daily for 10 days. 10/05/24 10/15/24 Yes Corlis Burnard DEL, NP  benzonatate  (TESSALON ) 200 MG capsule Take 200 mg by mouth. 10/03/24 10/10/24 Yes [provider]  metFORMIN (GLUCOPHAGE) 500 MG tablet Take 500 mg by mouth 2 (two) times daily with a meal. 07/21/24  Yes [provider]  topiramate (TOPAMAX) 50 MG tablet Take 50 mg by mouth 2 (two) times daily. 07/21/24  Yes [provider]  Apple Cider Vinegar 500 MG TABS Take by mouth daily.    [provider]  bisoprolol-hydrochlorothiazide (ZIAC) 5-6.25 MG tablet Take 1 tablet by mouth daily.    [provider]  bisoprolol-hydrochlorothiazide [facility]) 5-6.25  MG tablet     [provider]  Dulaglutide (TRULICITY) 1.5 MG/0.5ML SOPN Inject into the skin once a week. Waiting for insurance approval - NOT TAKING Patient not taking: Reported on 10/05/2024    [provider]  ergocalciferol (VITAMIN D2) 1.25 MG (50000 UT) capsule Take 50,000 Units by mouth once a week. X 4 weeks    [provider]  fluticasone  (FLONASE ) 50 MCG/ACT nasal spray Place 2 sprays into both nostrils daily. Patient taking differently: Place 2 sprays  into both nostrils once a week. 09/14/19   Menshew, Candida LULLA Kings, PA-C  ibuprofen  (ADVIL ) 800 MG tablet Take 1 tablet (800 mg total) by mouth every 8 (eight) hours as needed for mild pain or moderate pain. Patient not taking: Reported on 10/05/2024 07/10/22   Sakai, Isami, DO  lidocaine  (XYLOCAINE ) 5 % ointment Apply 1 Application topically 3 (three) times daily as needed. Apply pea size amount to area as needed Patient not taking: Reported on 10/05/2024 07/10/22   Sakai, Isami, DO    Family History History reviewed. No pertinent family history.  Social History Social History[1]   Allergies   Patient has no known allergies.   Review of Systems Review of Systems  Constitutional:  Negative for chills and fever.  HENT:  Positive for postnasal drip and sore throat. Negative for ear discharge.   Respiratory:  Positive for cough. Negative for shortness of breath.   Gastrointestinal:  Negative for diarrhea and vomiting.     Physical Exam Triage Vital Signs ED Triage Vitals [10/05/24 0913]  Encounter Vitals Group     BP 137/84     Girls Systolic BP Percentile      Girls Diastolic BP Percentile      Boys Systolic BP Percentile      Boys Diastolic BP Percentile      Pulse Rate 64     Resp 18     Temp 97.7 F (36.5 C)     Temp src      SpO2 97 %     Weight      Height      Head Circumference      Peak Flow      Pain Score      Pain Loc      Pain Education      Exclude from Growth Chart    No data found.  Updated Vital Signs BP 137/84   Pulse 64   Temp 97.7 F (36.5 C)   Resp 18   SpO2 97%   Visual Acuity Right Eye Distance:   Left Eye Distance:   Bilateral Distance:    Right Eye Near:   Left Eye Near:    Bilateral Near:     Physical Exam Constitutional:      General: He is not in acute distress. HENT:     Right Ear: Tympanic membrane normal.     Left Ear: Tympanic membrane normal.     Nose: Nose normal.     Mouth/Throat:     Mouth: Mucous membranes  are moist.     Pharynx: Posterior oropharyngeal erythema present.  Cardiovascular:     Rate and Rhythm: Normal rate and regular rhythm.     Heart sounds: Normal heart sounds.  Pulmonary:     Effort: Pulmonary effort is normal. No respiratory distress.     Breath sounds: Normal breath sounds.  Neurological:     Mental Status: He is alert.      UC Treatments /  Results  Labs (all labs ordered are listed, but only abnormal results are displayed) Labs Reviewed  POCT RAPID STREP A (OFFICE) - Abnormal; Notable for the following components:      Result Value   Rapid Strep A Screen Positive (*)    All other components within normal limits  POC SOFIA SARS ANTIGEN FIA    EKG   Radiology No results found.  Procedures Procedures (including critical care time)  Medications Ordered in UC Medications - No data to display  Initial Impression / Assessment and Plan / UC Course  I have reviewed the triage vital signs and the nursing notes.  Pertinent labs & imaging results that were available during my care of the patient were reviewed by me and considered in my medical decision making (see chart for details).    Strep pharyngitis.  Afebrile and vital signs are stable.  Lungs are clear and O2 sat is 97% on room air.  Rapid strep positive.  COVID-negative.  Treating with amoxicillin .  Tylenol  or ibuprofen  as needed.  Education provided on strep throat.  Instructed patient to follow-up with his PCP if he is not improving.  He agrees to plan of care.  Final Clinical Impressions(s) / UC Diagnoses   Final diagnoses:  Strep pharyngitis     Discharge Instructions      The strep test is positive.  COVID test is negative.   Take the amoxicillin  as directed for strep throat.    Take Tylenol  or ibuprofen  as needed for fever or discomfort.    Follow-up with your primary care provider if your symptoms are not improving.         ED Prescriptions     Medication Sig Dispense Auth.  Provider   amoxicillin  (AMOXIL ) 500 MG capsule Take 1 capsule (500 mg total) by mouth 2 (two) times daily for 10 days. 20 capsule Corlis Burnard DEL, NP      PDMP not reviewed this encounter.    [1]  Social History Tobacco Use   Smoking status: Former    Current packs/day: 0.00    Types: Cigarettes    Quit date: 08/03/2021    Years since quitting: 3.1   Smokeless tobacco: Never  Vaping Use   Vaping status: Never Used  Substance Use Topics   Alcohol use: Yes    Comment: rarely   Drug use: Never     Corlis Burnard DEL, NP 10/05/24 [id number]
